# Patient Record
Sex: Male | Born: 1960 | Race: Asian | Hispanic: No | Marital: Married | Smoking: Never smoker
Health system: Southern US, Community
[De-identification: ages and names within clinical notes are randomized; demographics above are authoritative.]

## PROBLEM LIST (undated history)

## (undated) DIAGNOSIS — E7800 Pure hypercholesterolemia, unspecified: Secondary | ICD-10-CM

## (undated) DIAGNOSIS — J45909 Unspecified asthma, uncomplicated: Secondary | ICD-10-CM

---

## 2010-05-27 ENCOUNTER — Emergency Department (HOSPITAL_COMMUNITY): Payer: Medicaid Other

## 2010-05-27 ENCOUNTER — Emergency Department (HOSPITAL_COMMUNITY)
Admission: EM | Admit: 2010-05-27 | Discharge: 2010-05-27 | Disposition: A | Discharge status: Home / Self Care | Payer: Medicaid Other | Attending: Emergency Medicine | Admitting: Emergency Medicine

## 2010-05-27 DIAGNOSIS — R51 Headache: Secondary | ICD-10-CM | POA: Insufficient documentation

## 2010-05-27 DIAGNOSIS — J40 Bronchitis, not specified as acute or chronic: Secondary | ICD-10-CM | POA: Insufficient documentation

## 2010-05-27 DIAGNOSIS — R059 Cough, unspecified: Secondary | ICD-10-CM | POA: Insufficient documentation

## 2010-05-27 DIAGNOSIS — R05 Cough: Secondary | ICD-10-CM | POA: Insufficient documentation

## 2010-05-27 DIAGNOSIS — J3489 Other specified disorders of nose and nasal sinuses: Secondary | ICD-10-CM | POA: Insufficient documentation

## 2010-05-27 DIAGNOSIS — E785 Hyperlipidemia, unspecified: Secondary | ICD-10-CM | POA: Insufficient documentation

## 2010-05-27 DIAGNOSIS — R0602 Shortness of breath: Secondary | ICD-10-CM | POA: Insufficient documentation

## 2010-05-27 DIAGNOSIS — Z79899 Other long term (current) drug therapy: Secondary | ICD-10-CM | POA: Insufficient documentation

## 2010-10-20 ENCOUNTER — Emergency Department (HOSPITAL_COMMUNITY): Payer: Medicaid Other

## 2010-10-20 ENCOUNTER — Inpatient Hospital Stay (HOSPITAL_COMMUNITY)
Admission: EM | Admit: 2010-10-20 | Discharge: 2010-10-24 | DRG: 864 | Disposition: A | Discharge status: Home / Self Care | Payer: Medicaid Other | Attending: Internal Medicine | Admitting: Internal Medicine

## 2010-10-20 DIAGNOSIS — J309 Allergic rhinitis, unspecified: Secondary | ICD-10-CM | POA: Diagnosis present

## 2010-10-20 DIAGNOSIS — R05 Cough: Secondary | ICD-10-CM | POA: Diagnosis present

## 2010-10-20 DIAGNOSIS — R509 Fever, unspecified: Principal | ICD-10-CM | POA: Diagnosis present

## 2010-10-20 DIAGNOSIS — E785 Hyperlipidemia, unspecified: Secondary | ICD-10-CM | POA: Diagnosis present

## 2010-10-20 DIAGNOSIS — R7309 Other abnormal glucose: Secondary | ICD-10-CM | POA: Diagnosis present

## 2010-10-20 DIAGNOSIS — R51 Headache: Secondary | ICD-10-CM | POA: Diagnosis present

## 2010-10-20 DIAGNOSIS — R059 Cough, unspecified: Secondary | ICD-10-CM | POA: Diagnosis present

## 2010-10-20 DIAGNOSIS — R7402 Elevation of levels of lactic acid dehydrogenase (LDH): Secondary | ICD-10-CM | POA: Diagnosis present

## 2010-10-20 DIAGNOSIS — R7401 Elevation of levels of liver transaminase levels: Secondary | ICD-10-CM | POA: Diagnosis present

## 2010-10-20 DIAGNOSIS — K7689 Other specified diseases of liver: Secondary | ICD-10-CM | POA: Diagnosis present

## 2010-10-20 DIAGNOSIS — R748 Abnormal levels of other serum enzymes: Secondary | ICD-10-CM | POA: Diagnosis present

## 2010-10-20 LAB — URINALYSIS, ROUTINE W REFLEX MICROSCOPIC
Bilirubin Urine: NEGATIVE
Glucose, UA: NEGATIVE mg/dL
Ketones, ur: NEGATIVE mg/dL
Leukocytes, UA: NEGATIVE
Protein, ur: NEGATIVE mg/dL

## 2010-10-20 LAB — COMPREHENSIVE METABOLIC PANEL
AST: 126 U/L — ABNORMAL HIGH (ref 0–37)
BUN: 7 mg/dL (ref 6–23)
CO2: 23 mEq/L (ref 19–32)
Calcium: 9.1 mg/dL (ref 8.4–10.5)
Creatinine, Ser: 0.93 mg/dL (ref 0.50–1.35)
GFR calc Af Amer: >60 mL/min (ref >60)
GFR calc non Af Amer: >60 mL/min (ref >60)
Glucose, Bld: 125 mg/dL — ABNORMAL HIGH (ref 70–99)
Total Bilirubin: 0.7 mg/dL (ref 0.3–1.2)

## 2010-10-20 LAB — MAGNESIUM: Magnesium: 1.8 mg/dL (ref 1.5–2.5)

## 2010-10-20 LAB — CBC
Hemoglobin: 15.5 g/dL (ref 13.0–17.0)
MCH: 30.1 pg (ref 26.0–34.0)
MCHC: 34.7 g/dL (ref 30.0–36.0)
MCV: 86.8 fL (ref 78.0–100.0)

## 2010-10-20 LAB — RAPID STREP SCREEN (MED CTR MEBANE ONLY): Streptococcus, Group A Screen (Direct): NEGATIVE

## 2010-10-20 LAB — DIFFERENTIAL
Basophils Relative: 0 % (ref 0–1)
Eosinophils Absolute: 0.2 10*3/uL (ref 0.0–0.7)
Lymphs Abs: 0.9 10*3/uL (ref 0.7–4.0)
Monocytes Absolute: 1.4 10*3/uL — ABNORMAL HIGH (ref 0.1–1.0)
Monocytes Relative: 13 % — ABNORMAL HIGH (ref 3–12)

## 2010-10-20 LAB — LACTIC ACID, PLASMA: Lactic Acid, Venous: 1 mmol/L (ref 0.5–2.2)

## 2010-10-21 ENCOUNTER — Inpatient Hospital Stay (HOSPITAL_COMMUNITY): Payer: Medicaid Other

## 2010-10-21 LAB — STREP A DNA PROBE

## 2010-10-21 LAB — EBV AB TO VIRAL CAPSID AG PNL, IGG+IGM
EBV VCA IgG: 2.2 {ISR} — ABNORMAL HIGH
EBV VCA IgM: 0.22 {ISR}

## 2010-10-21 MED ORDER — GADOXETATE DISODIUM 0.25 MMOL/ML IV SOLN
7.0000 mL | Freq: Once | INTRAVENOUS | Status: AC | PRN
  Administered 2010-10-21: 7 mL via INTRAVENOUS

## 2010-10-22 LAB — CBC
HCT: 45 % (ref 39.0–52.0)
Hemoglobin: 15.7 g/dL (ref 13.0–17.0)
MCHC: 34.9 g/dL (ref 30.0–36.0)
MCV: 87 fL (ref 78.0–100.0)
RDW: 11.9 % (ref 11.5–15.5)

## 2010-10-22 LAB — MALARIA SMEAR

## 2010-10-22 LAB — COMPREHENSIVE METABOLIC PANEL
Albumin: 3.6 g/dL (ref 3.5–5.2)
Alkaline Phosphatase: 89 U/L (ref 39–117)
BUN: 7 mg/dL (ref 6–23)
Chloride: 100 mEq/L (ref 96–112)
Creatinine, Ser: 0.84 mg/dL (ref 0.50–1.35)
GFR calc Af Amer: >60 mL/min (ref >60)
Glucose, Bld: 119 mg/dL — ABNORMAL HIGH (ref 70–99)
Potassium: 3.9 mEq/L (ref 3.5–5.1)
Total Bilirubin: 0.5 mg/dL (ref 0.3–1.2)
Total Protein: 7.2 g/dL (ref 6.0–8.3)

## 2010-10-22 LAB — LIPID PANEL
HDL: 35 mg/dL — ABNORMAL LOW (ref >39)
LDL Cholesterol: 119 mg/dL — ABNORMAL HIGH (ref 0–99)

## 2010-10-22 LAB — URINE CULTURE: Colony Count: NO GROWTH

## 2010-10-22 LAB — C-REACTIVE PROTEIN: CRP: 11.26 mg/dL — ABNORMAL HIGH (ref <0.60)

## 2010-10-23 ENCOUNTER — Inpatient Hospital Stay (HOSPITAL_COMMUNITY): Payer: Medicaid Other

## 2010-10-23 DIAGNOSIS — R509 Fever, unspecified: Secondary | ICD-10-CM

## 2010-10-23 LAB — HEPATITIS PANEL, ACUTE
HCV Ab: NEGATIVE
Hep B C IgM: NEGATIVE
Hepatitis B Surface Ag: NEGATIVE

## 2010-10-23 LAB — COMPREHENSIVE METABOLIC PANEL
Alkaline Phosphatase: 98 U/L (ref 39–117)
BUN: 7 mg/dL (ref 6–23)
Creatinine, Ser: 0.88 mg/dL (ref 0.50–1.35)
GFR calc Af Amer: >60 mL/min (ref >60)
Glucose, Bld: 95 mg/dL (ref 70–99)
Potassium: 3.8 mEq/L (ref 3.5–5.1)
Total Bilirubin: 0.5 mg/dL (ref 0.3–1.2)
Total Protein: 7.2 g/dL (ref 6.0–8.3)

## 2010-10-26 LAB — CULTURE, BLOOD (ROUTINE X 2)
Culture  Setup Time: 201208302251
Culture  Setup Time: 201208302251
Culture: NO GROWTH

## 2010-11-26 ENCOUNTER — Emergency Department (HOSPITAL_COMMUNITY)
Admission: EM | Admit: 2010-11-26 | Discharge: 2010-11-26 | Disposition: A | Discharge status: Home / Self Care | Payer: Medicaid Other | Attending: Emergency Medicine | Admitting: Emergency Medicine

## 2010-11-26 DIAGNOSIS — M545 Low back pain, unspecified: Secondary | ICD-10-CM | POA: Insufficient documentation

## 2010-11-26 DIAGNOSIS — H1010 Acute atopic conjunctivitis, unspecified eye: Secondary | ICD-10-CM | POA: Insufficient documentation

## 2010-11-26 DIAGNOSIS — J029 Acute pharyngitis, unspecified: Secondary | ICD-10-CM | POA: Insufficient documentation

## 2010-11-26 DIAGNOSIS — R6889 Other general symptoms and signs: Secondary | ICD-10-CM | POA: Insufficient documentation

## 2010-11-26 DIAGNOSIS — H5789 Other specified disorders of eye and adnexa: Secondary | ICD-10-CM | POA: Insufficient documentation

## 2010-11-26 LAB — URINALYSIS, ROUTINE W REFLEX MICROSCOPIC
Bilirubin Urine: NEGATIVE
Hgb urine dipstick: NEGATIVE
Specific Gravity, Urine: 1.007 (ref 1.005–1.030)
pH: 5.5 (ref 5.0–8.0)

## 2010-12-01 NOTE — Discharge Summary (Signed)
Brandon Zimmerman, Brandon Zimmerman NO.:  [id number]  MEDICAL RECORD NO.:  [id number]  LOCATION:  5005                         FACILITY:  [facility]  PHYSICIAN:  Brandon Zimmerman, MDDATE OF BIRTH:  [birth year]  DATE OF ADMISSION:  10/20/2010 DATE OF DISCHARGE:  10/24/2010                              DISCHARGE SUMMARY   DISCHARGE DIAGNOSES: 1. Low-grade fever post transaminitis. 2. Dyslipidemia. 3. Remote history of hepatitis. 4. Remote history of Epstein-Barr virus. 5. Seasonal allergies. 6. Hepatic steatosis.  DISCHARGE MEDICATIONS: 1. Doxycycline 100 mg 1 tablet by mouth twice a day for the next 7     days. 2. Fish oil 1000 mg 1 tablet by mouth twice a day. 3. Loratadine 10 mg 1 tablet by mouth daily.  DISPOSITION AND FOLLOWUP:  The patient had been discharged in stable and improved condition currently being afebrile for 24 hours with instructions to finish his antibiotics over the next 7 days and to arrange a followup appointment with primary care physician in 7-10 days. The patient was instructed to follow a low-fat diet and to take his fish oil as indicated.  It is going to be important during his followup to have repeat comprehensive metabolic panel in order to look at the patient's liver function tests, which were elevated on this admission to make sure that they have returned to normal.  PROCEDURES PERFORMED DURING THIS HOSPITALIZATION:  The patient had chest x-rayed that demonstrated no acute cardiopulmonary disease on October 20, 2010.  He also had an ultrasound of the abdomen that demonstrated no acute finding by ultrasound, negative for gallstones or biliary dilatation.  There was a diffuse heterogenicity of the liver with the surface nodularity projecting in the left hepatic lobe with a focal nodular area versus a lesion measuring 2.4 cm.  The radiologist recommended CT or MRI for further evaluation.  The patient had an MRI without contrast that  demonstrated mild hepatic steatosis without evidence of hepatic neoplasm or other significant abnormality.  The patient had a followup x-ray on October 23, 2010, that demonstrated no acute cardiopulmonary disease.  No other procedures were performed during this hospitalization.  Infectious Disease, specifically Dr. Cliffton Zimmerman were consulted during this admission.  No other consultations were made.  BRIEF HISTORY OF PRESENT ILLNESS:  For full details, please refer to dictation done by Dr. 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  He was there for 1 month returning on October 18, 2010.  Shortly after the patient returned, he developed fever and chills and was admitted on October 20, 2010, for further evaluation and treatment.  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  Initial evaluation in the emergency department found the patient to be febrile with a fever up to 103 and also with acute findings of transaminitis.  He was admitted for further evaluation and treatment.  PERTINENT LABORATORY DATA:  CBC with differential with a white blood cells  of 11.1, hemoglobin 15.5, platelets 160.  Urinalysis was negative. Comprehensive metabolic panel with a sodium of 136, potassium 3.8, chloride 103, bicarb 23, glucose 125, BUN 7, creatinine 0.93, alkaline phosphatase 99, AST 126, ALT 161, albumin 3.6, lactic acid was 1.0.  The patient had a rapid strep screen direct that was negative.  Magnesium was 1.8, phosphorus was 3.2.  Monospot screen was negative.  Acute hepatitis panel was negative.  The patient have hepatitis A total antibody positive representing a prior infection/vaccine.  Blood cultures were negative.  Strep group A  probe was negative.  EBV IgG 2.20, which represents a previous infections versus vaccination.  The patient had a ESR of 24, CRP 11.26.  Lipid profile showed a total cholesterol of 194, triglycerides 202, HDL 35, LDL 119.  Malaria smear was negative.  Urine culture demonstrated no growth.  At discharge, hepatitis panel demonstrated an AST of 74 with an ALT of 131.  Basic metabolic panel demonstrated a sodium of 138, potassium 3.8, chloride 103, bicarb 23, glucose 95, BUN 7, creatinine 0.88.  HOSPITAL COURSE BY PROBLEM: 1. Transaminitis and low-grade fever.  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  Reason why he was started on     doxycycline.  The patient in less than 48 hours had a complete     clear up of his fever and he is feeling back to normal.  At this     point, we are going to continue 7 more days of doxycycline to     complete 10 days and he is going to follow with primary care     physician as an outpatient and the patient to see disease     guidelines and instructions doubt that there is any malaria     infection or any other infection that will require further     treatment. 2. Seasonal allergic rhinitis.  The patient had been started on     loratadine 10 mg by mouth daily. 3. Dyslipidemia.  The patient had been advised to follow a low-sodium     diet and to take fish oil 2 g by mouth daily. 4. Hyperglycemia.  First blood work taken was postprandial blood test     with a blood sugar in the 125, which might suggest probably mild     glucose intolerance.  Further fasting glucose checking throughout     this hospitalization were in the normal range, so no treatment have     been recommended.  At this point, the patient is going to follow     with primary care physician and further regular checkup of his     blood sugar will be granted.  PHYSICAL EXAMINATION:  VITAL SIGNS:  At discharge,  temperature 98.7, respiratory rate 18, heart rate 96, blood pressure 112/71, oxygen saturation 92% on room air. GENERAL:  He was in no acute distress. RESPIRATORY:  Clear to auscultation. HEART:  S1 and S2 without murmurs, gallops, or rubs. ABDOMEN:  Soft, nontender with positive bowel sounds. EXTREMITIES:  Without any edema. SKIN:  Without any rash. NEUROLOGIC:  Nonfocal.    Brandon Randy, MD    CEM/MEDQ  D:  10/24/2010  T:  10/24/2010  Job:  [id number]  cc:   Tanya D. Daphine Deutscher, M.D.  Electronically Signed by Vassie Loll MD on 12/01/2010 07:46:51 AM

## 2010-12-05 NOTE — H&P (Signed)
Brandon Zimmerman, Brandon Zimmerman                  ACCOUNT NO.:  [id number]  MEDICAL RECORD NO.:  [id number]  LOCATION:  [facility]                         FACILITY:  [facility]  PHYSICIAN:  Denae Zulueta I Lashawn Orrego, MD      DATE OF BIRTH:  11-Aug-1960  DATE OF ADMISSION:  10/20/2010 DATE OF DISCHARGE:                             HISTORY & PHYSICAL   PRIMARY CARE PHYSICIAN:  Dispensing optician.  CHIEF COMPLAINT:  Fever and chills for 2 days.  HISTORY OF PRESENT ILLNESS:  This is a 50 year old who is originally from [place], presented to the ED with chief complaint of fever and chills for 2 days.  The patient admitted he was on overseas trip where he visited [place] and [place] for 1 month.  He did not have any feeling of fever at that time.  The patient came to [place] on August 28, and since then started to develop high-grade fever associated with chills and body ache.  Denies any joint swelling and denies any rash.  Denies any contact with sick people.  Denies any nausea, vomiting, abdominal pain, or diarrhea.  He tried over-the-counter ibuprofen without significant relief.  The patient also complained of nonproductive cough, sore throat, malaise, and nasal congestion.  Also complained of headache. The patient denies any stiff neck, denies any altered mental status, denies any abnormal rash.  On presentation, the patient was found to have a fever of 100.5 and a chest x-ray, which was negative for any evidence of pneumonia.  PAST MEDICAL HISTORY:  He has history of hyperlipidemia.  MEDICATIONS:  Ibuprofen.  PAST SURGICAL HISTORY:  None.  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  Denies any smoking, denies any illicit drug abuse.  Denies any IV drug abuse and denies any drinking.  FAMILY HISTORY:  Noncontributory.  ALLERGIES:  POLLEN.  SYSTEMIC REVIEW:  The patient denies any altered mental status with no neck stiffness or neck pain.  Denies any back pain.  Does complain  of generalized body pain and muscle pain, and complain of headache on and off.  Complain of sore throat and nasal congestion.  Denies any chest pain or shortness of breath.  Denies any nausea, vomiting, or abdominal pain.  Denies any change in bowel habit.  Denies any dysuria.  Denies any back pain.  Complain of bilateral lower extremity swelling, which resolved after he left the airport.  PHYSICAL EXAMINATION:  VITAL SIGNS:  Temperature 100.4 with a T-max of 101.4, blood pressure 122/77, pulse rate 123, respiratory rate 20, sat 97% on room air. HEENT:  Normocephalic, atraumatic.  Pupils equal and reactive to light and accommodation.  Extraocular muscle movement within normal.  No jaundice noted. HEART:  S1 and S2 with no added sound. LUNGS:  Normal vesicular breathing with equal air entry. THROAT:  There is a mild tonsillar head atrophy, but there is no tonsillar exudate. ABDOMEN:  Soft, nontender.  No organomegaly.  Murphy sign, negative.  No rebound.  No guarding. EXTREMITIES:  No lower limb edema.  Peripheral pulses intact.LAB:  Blood workup did show white blood cells 11.1, hemoglobin 15.5, hematocrit 44.7, platelets 160, neutrophil 77.  He has evidence of monocytosis and neutrophil absolute 8.6.  Urinalysis, negative.  Sodium 136, potassium 3.8, chloride 103, CO2 23, glucose 125, BUN 7, creatinine of 0.93, total bilirubin 0.7, alkaline phosphatase 99, AST 126, ALT 161, total protein 7.2, albumin 3.6, calcium 9.1.  Ultrasound of the abdomen, diffuse heterogenicity of the liver with __________projecting in the left hepatic lobe with focal nodular area, whereas this lesion measuring 2.5.  Consider followup evaluation with CT or MRI.  Chest x-ray, negative.  ASSESSMENT AND PLAN:  This is a 50 year old gentleman who had recent trip to [place] and to [place], presented with fever and generalized body pain and ache.  Chest x-ray was negative for pneumonia.  Urinalysis is clear for any  urinary tract infection, but he has our respiratory tract symptoms including sore throat and nasal congestion.  This could be simply to virus infection, but we have to exclude other infection especially with elevated LFTs.  We have to exclude hepatitis, infectious mononucleosis, malaria, and West Nile virus, which is likely.  We will treat the patient currently with Zithromax and Rocephin as a case of upper airway infection/early pneumonia especially with his coughing.  We will hydrate the patient.  We will get blood culture x2.  We will check the blood smear for malaria and check antibodies for Epstein-Barr virus, IgM and IgG, and West Nile virus.  We will get hepatitis profile.  We will continue monitor the fever.  If the patient continues to have fever, we will consult the Infectious Disease.  The patient denies any change in bowel habit.  This is exclude typhoid fever or dysentery.  Abnormal LFTs, mainly transaminitis.  I will proceed with getting hepatitis profile and Tylenol level and aspirin level.  We will get also MRI of the abdomen taking the liver protocol to exclude any liver abscess, which is unlikely.  We will follow the LFTs.  The patient will be admitted to regular floor.     Brandon Scism Bosie Helper, MD     HIE/MEDQ  D:  10/20/2010  T:  10/20/2010  Job:  [id number]  Electronically Signed by Ebony Cargo MD on 12/05/2010 04:32:44 PM

## 2013-07-07 ENCOUNTER — Encounter (HOSPITAL_COMMUNITY): Payer: Self-pay | Admitting: Emergency Medicine

## 2013-07-07 ENCOUNTER — Emergency Department (HOSPITAL_COMMUNITY)
Admission: EM | Admit: 2013-07-07 | Discharge: 2013-07-07 | Disposition: A | Discharge status: Home / Self Care | Payer: Medicaid Other | Attending: Emergency Medicine | Admitting: Emergency Medicine

## 2013-07-07 ENCOUNTER — Emergency Department (HOSPITAL_COMMUNITY): Payer: Medicaid Other

## 2013-07-07 DIAGNOSIS — R519 Headache, unspecified: Secondary | ICD-10-CM

## 2013-07-07 DIAGNOSIS — R51 Headache: Secondary | ICD-10-CM | POA: Insufficient documentation

## 2013-07-07 DIAGNOSIS — J45909 Unspecified asthma, uncomplicated: Secondary | ICD-10-CM | POA: Insufficient documentation

## 2013-07-07 DIAGNOSIS — E7800 Pure hypercholesterolemia, unspecified: Secondary | ICD-10-CM | POA: Insufficient documentation

## 2013-07-07 DIAGNOSIS — Z79899 Other long term (current) drug therapy: Secondary | ICD-10-CM | POA: Insufficient documentation

## 2013-07-07 DIAGNOSIS — E785 Hyperlipidemia, unspecified: Secondary | ICD-10-CM | POA: Insufficient documentation

## 2013-07-07 HISTORY — DX: Pure hypercholesterolemia, unspecified: E78.00

## 2013-07-07 HISTORY — DX: Unspecified asthma, uncomplicated: J45.909

## 2013-07-07 MED ORDER — KETOROLAC TROMETHAMINE 30 MG/ML IJ SOLN
30.0000 mg | Freq: Once | INTRAMUSCULAR | Status: AC
  Administered 2013-07-07: 30 mg via INTRAMUSCULAR
  Filled 2013-07-07: qty 1

## 2013-07-07 MED ORDER — KETOROLAC TROMETHAMINE 30 MG/ML IJ SOLN: 30.0000 mg | Freq: Once | INTRAMUSCULAR | Status: DC

## 2013-07-07 NOTE — ED Notes (Signed)
Back of head hurting  X 1 week  No n/v/ denies any injury  Still able to drive no blurred vision hurts to touch he states

## 2013-07-07 NOTE — ED Provider Notes (Signed)
CSN: [id number]     Arrival date & time 07/07/13  1325 History   First MD Initiated Contact with Patient 07/07/13 1655     Chief Complaint  Patient presents with  . Headache     (Consider location/radiation/quality/duration/timing/severity/associated sxs/prior Treatment) HPI Comments: Patient presents with a chief complaint of headache.  He reports that the headache is located posteriorly and has been present for the past week.  He reports that the pain is intermittent.  Nothing in particular brings on the pain.  Pain lasts for a couple of hours at a time.  He reports that the scalp is tender to palpation.  He has not taken anything for pain.  He denies vertigo.  Denies weakness, neck pain, fever, chills, numbness, tingling, or vision changes.  He reports that he has never had a headache like this before. He does have prior history of Hyperlipidemia.  No prior history of DM or HTN.  No prior history of CVA.  No history of Migraines.  The history is provided by the patient.    Past Medical History  Diagnosis Date  . Asthma   . High cholesterol    History reviewed. No pertinent past surgical history. No family history on file. History  Substance Use Topics  . Smoking status: Never Smoker   . Smokeless tobacco: Not on file  . Alcohol Use: No    Review of Systems  Neurological: Positive for headaches.  All other systems reviewed and are negative.     Allergies  Review of patient's allergies indicates no known allergies.  Home Medications   Prior to Admission medications   Medication Sig Start Date End Date Taking? Authorizing Provider  cetirizine (ZYRTEC) 10 MG tablet Take 10 mg by mouth daily.   Yes Historical Provider, MD  Multiple Vitamin (MULTIVITAMIN WITH MINERALS) TABS tablet Take 1 tablet by mouth daily.   Yes Historical Provider, MD   BP 115/79  Pulse 103  Temp(Src) 98.4 F (36.9 C)  SpO2 96% Physical Exam  Nursing note and vitals reviewed. Constitutional: He  appears well-developed and well-nourished.  HENT:  Head: Normocephalic and atraumatic.  Mouth/Throat: Oropharynx is clear and moist.  No tenderness to palpation of the temporal arteries No erythema or rash of the scalp  Neck: Normal range of motion. Neck supple.  Cardiovascular: Normal rate, regular rhythm and normal heart sounds.   Pulmonary/Chest: Effort normal and breath sounds normal.  Neurological: He is alert. He has normal strength. No cranial nerve deficit or sensory deficit. He displays a negative Romberg sign. Coordination and gait normal.  Normal gait, no ataxia Normal finger to nose testing Normal rapid alternating movements. No nystagmus   Skin: Skin is warm and dry.  Psychiatric: He has a normal mood and affect.    ED Course  Procedures (including critical care time) Labs Review Labs Reviewed - No data to display  Imaging Review Ct Head Wo Contrast  07/07/2013   CLINICAL DATA:  53 year old male with headache.  EXAM: CT HEAD WITHOUT CONTRAST  TECHNIQUE: Contiguous axial images were obtained from the base of the skull through the vertex without intravenous contrast.  COMPARISON:  None.  FINDINGS: No acute intracranial abnormalities are identified, including mass lesion or mass effect, hydrocephalus, extra-axial fluid collection, midline shift, hemorrhage, or acute infarction.  The visualized bony calvarium is unremarkable except for a headache remote right zygoma fracture. Marland Kitchen.  IMPRESSION: No evidence of intracranial abnormality.   Electronically Signed   By: Dolores FrameJeff  Hu M.D.  On: 07/07/2013 18:40     EKG Interpretation None    Patient discussed with Dr. Gwendolyn GrantWalden  6:54 PM Patient reports that his headache has improved at this time.  MDM   Final diagnoses:  None  Patient presenting with headache that has been intermittent for the past week.  Patient is afebrile. Normal neurological exam.  Head CT is negative.  Headache improved with Toradol.  Presentation is  non  concerning for [facility], ICH, Meningitis, or temporal arteritis. Pt with no focal neuro deficits, nuchal rigidity, or change in vision. Pt stable for discharge.  Return precautions discussed.  Pt verbalizes understanding and is agreeable with plan to dc.      Santiago GladHeather Kayleana Waites, PA-C 07/07/13 1927

## 2013-07-07 NOTE — ED Provider Notes (Signed)
Medical screening examination/treatment/procedure(s) were performed by non-physician practitioner and as supervising physician I was immediately available for consultation/collaboration.   EKG Interpretation None        Dagmar HaitWilliam Marque Bango, MD 07/07/13 2332

## 2015-02-28 IMAGING — CT CT HEAD W/O CM
1 series · 16 of 30 positions shown, 20 images · non-contrast
Comparison: None.

CLINICAL DATA: 52-year-old male with headache.

EXAM:
CT HEAD WITHOUT CONTRAST
TECHNIQUE: Contiguous axial images were obtained from the base of the skull
through the vertex without intravenous contrast.

[Series 2: head 5.0 h30s · axial · 0.36mm/px · z∈[-72,+63]mm · 16 of 30 slices shown, 20 images]
[im 2/30  brain]
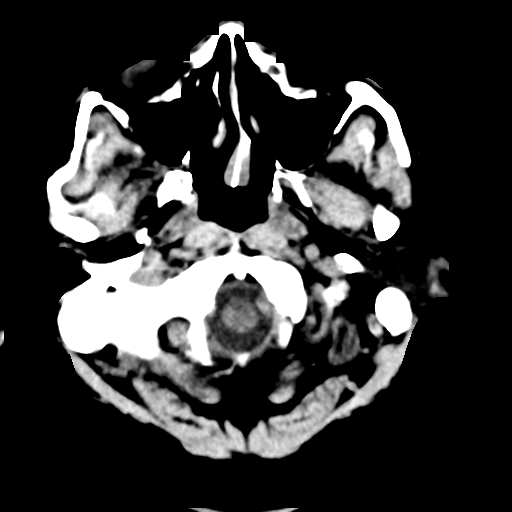
[im 2/30  bone]
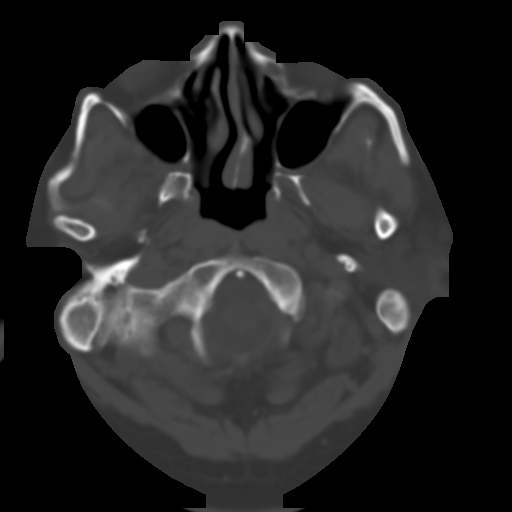
[im 4/30  brain]
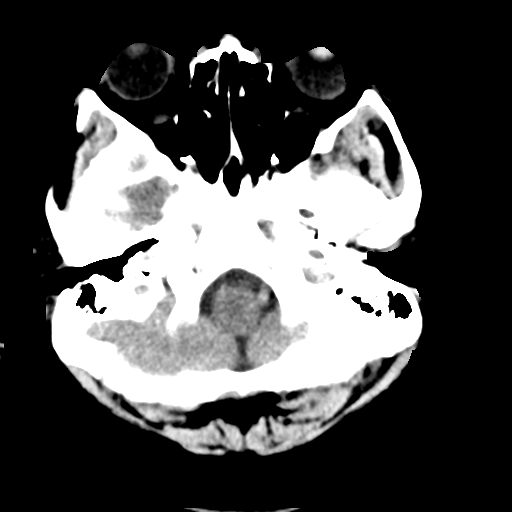
[im 6/30  brain]
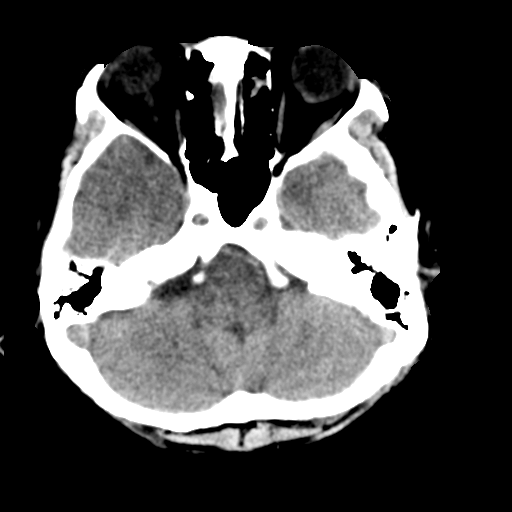
[im 8/30  brain]
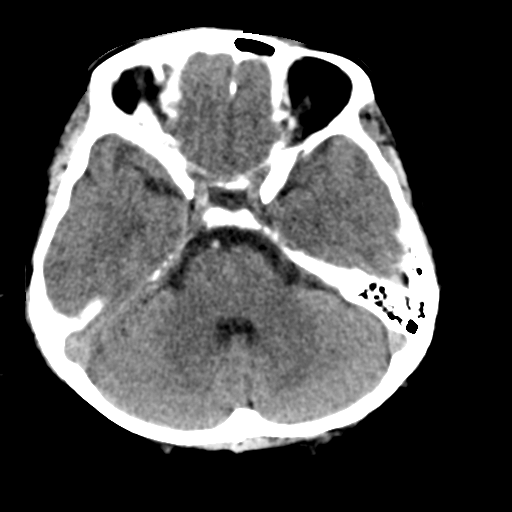
[im 9/30  brain]
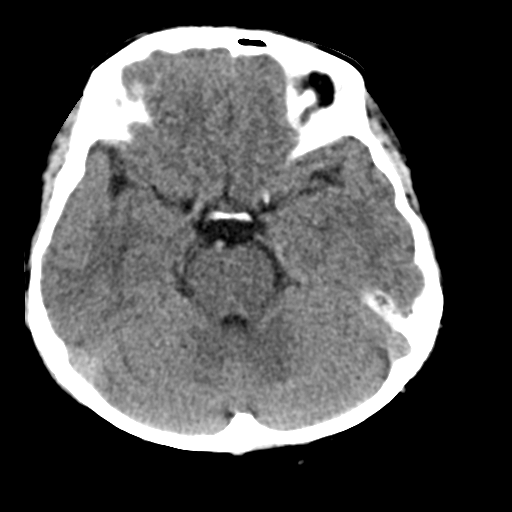
[im 9/30  bone]
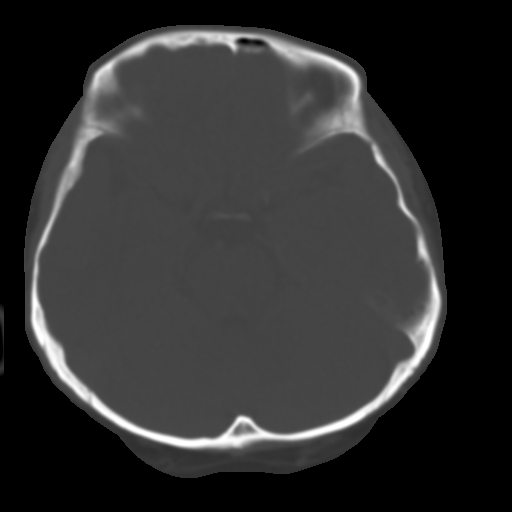
[im 11/30  brain]
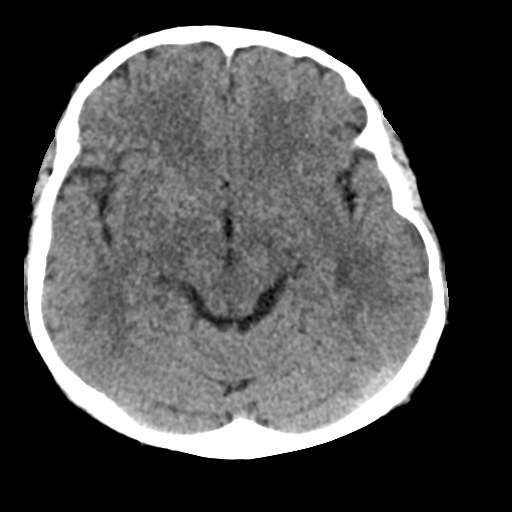
[im 13/30  brain]
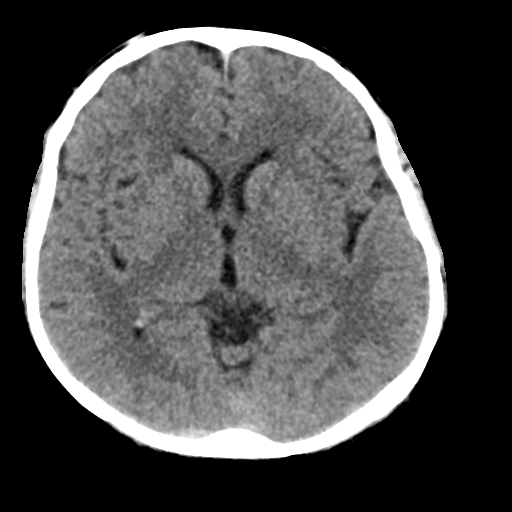
[im 15/30  brain]
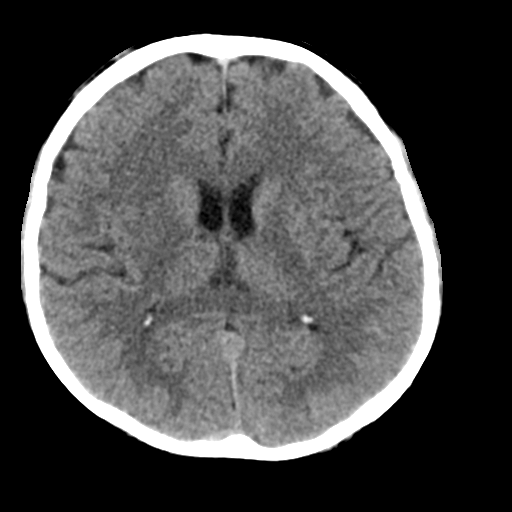
[im 16/30  brain]
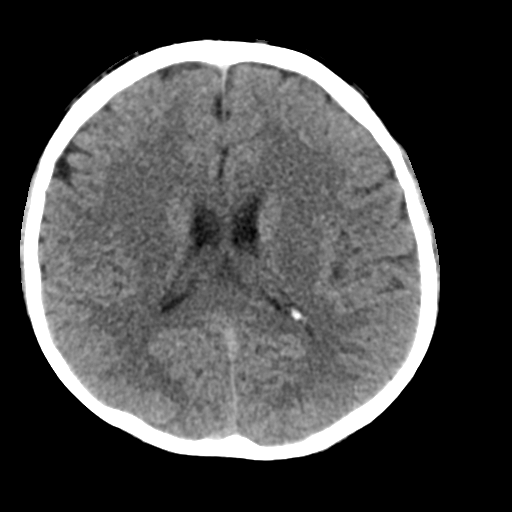
[im 16/30  bone]
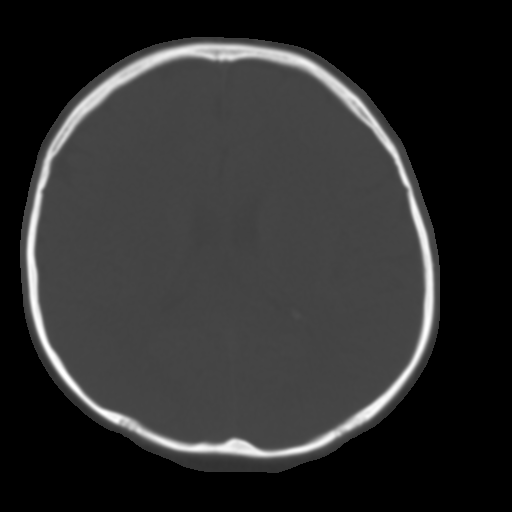
[im 18/30  brain]
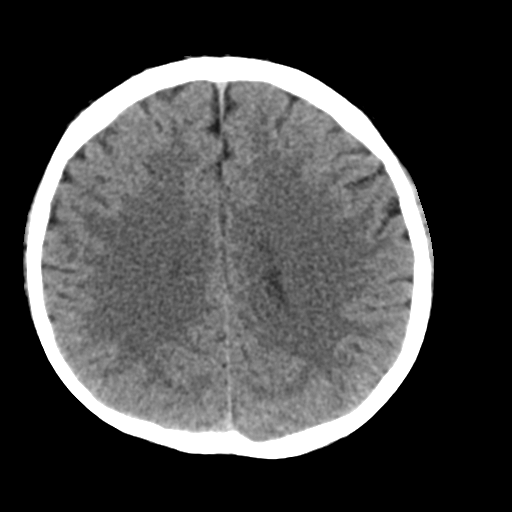
[im 20/30  brain]
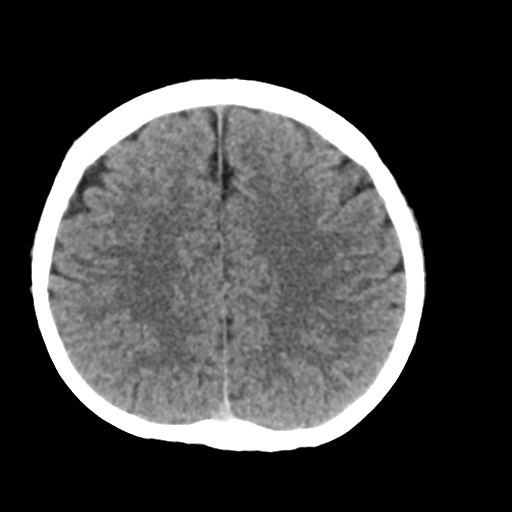
[im 22/30  brain]
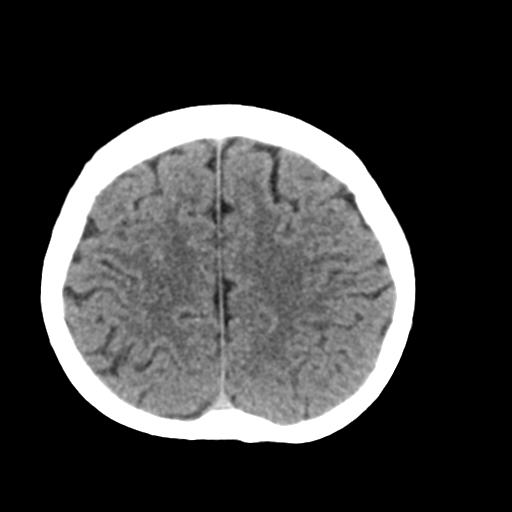
[im 23/30  brain]
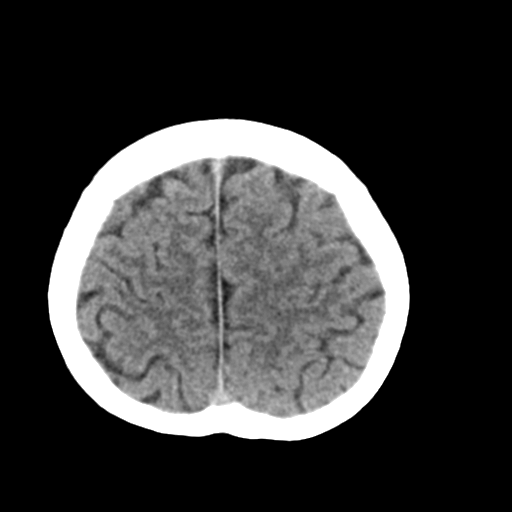
[im 23/30  bone]
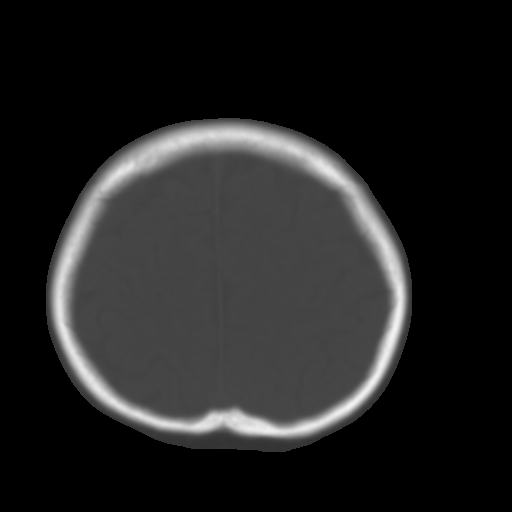
[im 25/30  brain]
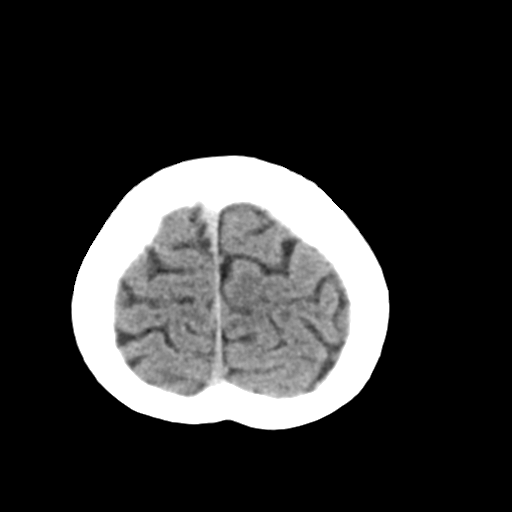
[im 27/30  brain]
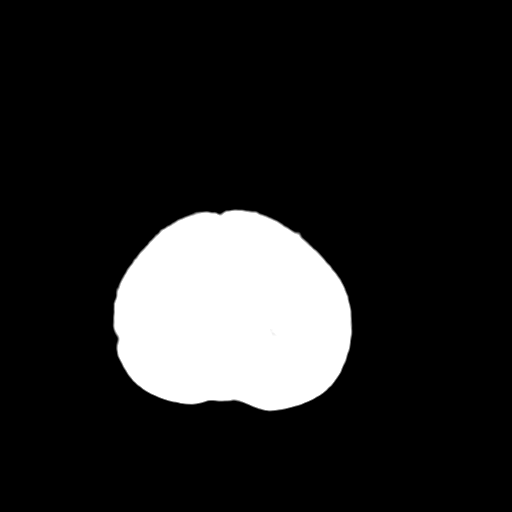
[im 29/30  brain]
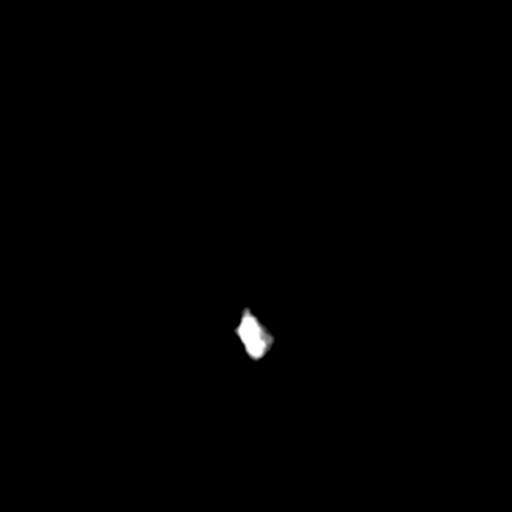

[16 of 30 positions shown; findings below may reference images not displayed]

FINDINGS: No acute intracranial abnormalities are identified, including mass
lesion or mass effect, hydrocephalus, extra-axial fluid collection,
midline shift, hemorrhage, or acute infarction.

The visualized bony calvarium is unremarkable except for a headache
remote right zygoma fracture. .
IMPRESSION: No evidence of intracranial abnormality.

## 2019-05-23 ENCOUNTER — Ambulatory Visit: Payer: Medicaid Other | Attending: Internal Medicine

## 2019-05-23 DIAGNOSIS — Z23 Encounter for immunization: Secondary | ICD-10-CM

## 2019-05-23 NOTE — Progress Notes (Signed)
   Covid-19 Vaccination Clinic  Name:  Brandon Zimmerman    MRN: [id number] DOB: May 24, 1960  05/23/2019  [name] was observed post Covid-19 immunization for 15 minutes without incident. He was provided with Vaccine Information Sheet and instruction to access the V-Safe system.   [name] was instructed to call 911 with any severe reactions post vaccine: Marland Kitchen Difficulty breathing  . Swelling of face and throat  . A fast heartbeat  . A bad rash all over body  . Dizziness and weakness   Immunizations Administered    Name Date Dose VIS Date Route   Pfizer COVID-19 Vaccine 05/23/2019  9:16 AM 0.3 mL 01/31/2019 Intramuscular   Manufacturer: [employer], [employer]   Lot: [id number]   NDC: [address]-7

## 2019-06-16 ENCOUNTER — Ambulatory Visit: Payer: Medicaid Other

## 2021-02-01 ENCOUNTER — Encounter: Payer: Self-pay | Admitting: Dietician

## 2021-02-01 ENCOUNTER — Encounter: Payer: 59 | Attending: Family Medicine | Admitting: Dietician

## 2021-02-01 ENCOUNTER — Other Ambulatory Visit: Payer: Self-pay

## 2021-02-01 DIAGNOSIS — E119 Type 2 diabetes mellitus without complications: Secondary | ICD-10-CM | POA: Insufficient documentation

## 2021-02-01 NOTE — Patient Instructions (Signed)
Choose lean meats and low-fat dairy like 1% milk to lower your intake of saturated fat.  Check your blood sugar each morning before eating or drinking (fasting). Look for numbers under 125 mg/dL  Your goal W9Q is below 7.0%  Work towards eating three meals a day, about 5-6 hours apart!  Begin to recognize carbohydrates in your food choices! For your rice, pasta, and fruits - 1 serving size is the size of your fist.  Begin to build your meals using the proportions of the Balanced Plate. First, select your carb choice(s) for the meal.  1 fist Next, select your source of protein to pair with your carb choice(s). - 1 palm of your hand Finally, complete the remaining half of your meal with a variety of non-starchy vegetables. - 2 fists

## 2021-02-01 NOTE — Progress Notes (Signed)
Diabetes Self-Management Education  Visit Type: First/Initial  Appt. Start Time: 1410 Appt. End Time: 1500  02/01/2021  Mr. Brandon Zimmerman, identified by name and date of birth, is a 60 y.o. male with a diagnosis of Diabetes: Type 2.   ASSESSMENT  Pt is taking metformin BID for their diabetes, no GI distress. Pt reports joint pain in their right shoulder that started after beginning their metformin. Pt reports forgetting to take their metformin occasionally. Pt states they were surprised by their diagnosis, pt did not notice any symptoms of hyperglycemia. Pt Reports family history of diabetes in both their mother and father. Pt reports history of hypotension that has lead to faintness. Pt has a blood pressure cuff and checks BP when feeling "off". Pt reports trying to control their sugar intake, uses Stevia in their coffee. Pt reports eating a lot of jasmine and basmati rice daily, bread and noodles, and fruit often. Pt reports having a glucometer, but is not currently checking their blood sugar. Pt states they know how to use their meter and will begin checking. Pt reports skipping breakfast consistently, usually only has coffee.  There were no vitals taken for this visit. There is no height or weight on file to calculate BMI.   Diabetes Self-Management Education - 02/01/21 1416       Visit Information   Visit Type First/Initial      Initial Visit   Diabetes Type Type 2    Are you currently following a meal plan? No    Are you taking your medications as prescribed? Yes    Date Diagnosed October, 2022      Health Coping   How would you rate your overall health? Good      Psychosocial Assessment   Patient Belief/Attitude about Diabetes Motivated to manage diabetes    Self-care barriers None;English as a second language    Self-management support Doctor's office    Other persons present Patient    Patient Concerns Nutrition/Meal planning    Special Needs Simplified materials     Preferred Learning Style Visual    Learning Readiness Ready    How often do you need to have someone help you when you read instructions, pamphlets, or other written materials from your doctor or pharmacy? 1 - Never    What is the last grade level you completed in school? Master's degree      Pre-Education Assessment   Patient understands the diabetes disease and treatment process. Needs Instruction    Patient understands incorporating nutritional management into lifestyle. Needs Instruction    Patient undertands incorporating physical activity into lifestyle. Needs Instruction    Patient understands using medications safely. Needs Instruction    Patient understands monitoring blood glucose, interpreting and using results Needs Instruction    Patient understands prevention, detection, and treatment of acute complications. Needs Instruction    Patient understands prevention, detection, and treatment of chronic complications. Needs Instruction    Patient understands how to develop strategies to address psychosocial issues. Needs Instruction    Patient understands how to develop strategies to promote health/change behavior. Needs Instruction      Complications   Last HgB A1C per patient/outside source 9.7 %   12/06/2020   How often do you check your blood sugar? 0 times/day (not testing)    Number of hypoglycemic episodes per month 0    Number of hyperglycemic episodes per week 0    Have you had a dilated eye exam in the past 12 months?  No    Have you had a dental exam in the past 12 months? Yes    Are you checking your feet? No      Dietary Intake   Lunch Tom yung noodles, green leafy vegetables,    Dinner Peanut butter and jelly sandwich, coffee with milk      Exercise   Exercise Type ADL's;Light (walking / raking leaves)    How many days per week to you exercise? 2    How many minutes per day do you exercise? 20    Total minutes per week of exercise 40      Patient Education    Previous Diabetes Education No    Disease state  Factors that contribute to the development of diabetes    Nutrition management  Carbohydrate counting;Role of diet in the treatment of diabetes and the relationship between the three main macronutrients and blood glucose level;Meal timing in regards to the patients' current diabetes medication.;Meal options for control of blood glucose level and chronic complications.    Physical activity and exercise  Role of exercise on diabetes management, blood pressure control and cardiac health.    Medications Reviewed patients medication for diabetes, action, purpose, timing of dose and side effects.    Monitoring Purpose and frequency of SMBG.;Identified appropriate SMBG and/or A1C goals.    Chronic complications Nephropathy, what it is, prevention of, the use of ACE, ARB's and early detection of through urine microalbumia.;Relationship between chronic complications and blood glucose control;Retinopathy and reason for yearly dilated eye exams      Individualized Goals (developed by patient)   Nutrition Follow meal plan discussed    Physical Activity Exercise 3-5 times per week    Monitoring  test my blood glucose as discussed      Post-Education Assessment   Patient understands the diabetes disease and treatment process. Needs Review    Patient understands incorporating nutritional management into lifestyle. Needs Review    Patient undertands incorporating physical activity into lifestyle. Needs Review    Patient understands using medications safely. Needs Review    Patient understands monitoring blood glucose, interpreting and using results Needs Review    Patient understands prevention, detection, and treatment of acute complications. Needs Review    Patient understands prevention, detection, and treatment of chronic complications. Needs Review    Patient understands how to develop strategies to address psychosocial issues. Needs Review    Patient  understands how to develop strategies to promote health/change behavior. Needs Review      Outcomes   Expected Outcomes Demonstrated interest in learning. Expect positive outcomes    Future DMSE PRN    Program Status Not Completed             Individualized Plan for Diabetes Self-Management Training:   Learning Objective:  Patient will have a greater understanding of diabetes self-management. Patient education plan is to attend individual and/or group sessions per assessed needs and concerns.   Plan:   Patient Instructions  Choose lean meats and low-fat dairy like 1% milk to lower your intake of saturated fat.  Check your blood sugar each morning before eating or drinking (fasting). Look for numbers under 125 mg/dL  Your goal P7T is below 7.0%  Work towards eating three meals a day, about 5-6 hours apart!  Begin to recognize carbohydrates in your food choices! For your rice, pasta, and fruits - 1 serving size is the size of your fist.  Begin to build your meals using the proportions  of the Balanced Plate. First, select your carb choice(s) for the meal.  1 fist Next, select your source of protein to pair with your carb choice(s). - 1 palm of your hand Finally, complete the remaining half of your meal with a variety of non-starchy vegetables. - 2 fists    Expected Outcomes:  Demonstrated interest in learning. Expect positive outcomes  Education material provided: My Plate, Food List  If problems or questions, patient to contact team via:  Phone and Email  Future DSME appointment: PRN
